# Patient Record
Sex: Male | Born: 1985 | Race: Asian | Hispanic: No | Marital: Married | Smoking: Never smoker
Health system: Southern US, Community
[De-identification: ages and names within clinical notes are randomized; demographics above are authoritative.]

## PROBLEM LIST (undated history)

## (undated) HISTORY — PX: EYE SURGERY: SHX253

---

## 2011-06-03 ENCOUNTER — Other Ambulatory Visit: Payer: Self-pay | Admitting: Infectious Diseases

## 2011-06-03 DIAGNOSIS — R9389 Abnormal findings on diagnostic imaging of other specified body structures: Secondary | ICD-10-CM

## 2011-06-05 ENCOUNTER — Ambulatory Visit
Admission: RE | Admit: 2011-06-05 | Discharge: 2011-06-05 | Disposition: A | Discharge status: Home / Self Care | Payer: No Typology Code available for payment source | Source: Ambulatory Visit | Attending: Infectious Diseases | Admitting: Infectious Diseases

## 2011-06-05 DIAGNOSIS — R9389 Abnormal findings on diagnostic imaging of other specified body structures: Secondary | ICD-10-CM

## 2011-06-21 ENCOUNTER — Inpatient Hospital Stay (INDEPENDENT_AMBULATORY_CARE_PROVIDER_SITE_OTHER)
Admission: RE | Admit: 2011-06-21 | Discharge: 2011-06-21 | Disposition: A | Discharge status: Home / Self Care | Payer: Self-pay | Source: Ambulatory Visit | Attending: Emergency Medicine | Admitting: Emergency Medicine

## 2011-06-21 DIAGNOSIS — J069 Acute upper respiratory infection, unspecified: Secondary | ICD-10-CM

## 2011-10-19 ENCOUNTER — Emergency Department (INDEPENDENT_AMBULATORY_CARE_PROVIDER_SITE_OTHER)
Admission: EM | Admit: 2011-10-19 | Discharge: 2011-10-19 | Disposition: A | Discharge status: Home / Self Care | Payer: Medicaid Other | Source: Home / Self Care | Attending: Emergency Medicine | Admitting: Emergency Medicine

## 2011-10-19 ENCOUNTER — Encounter: Payer: Self-pay | Admitting: *Deleted

## 2011-10-19 DIAGNOSIS — M546 Pain in thoracic spine: Secondary | ICD-10-CM

## 2011-10-19 MED ORDER — HYDROCODONE-ACETAMINOPHEN 5-325 MG PO TABS: 2.0000 | ORAL_TABLET | ORAL | Status: AC | PRN

## 2011-10-19 NOTE — ED Provider Notes (Addendum)
History     CSN: [id number]  Arrival date & time 10/19/11  1530   First MD Initiated Contact with Patient 10/19/11 1533      Chief Complaint  Patient presents with  . Chest Pain    (Consider location/radiation/quality/duration/timing/severity/associated sxs/prior treatment) HPI Comments: Was lifting something heavy yesterday, started hurting on my back " lie i pulled something"  NO SOB NO URINARY SYMPTOMS  Patient is a 26 y.o. male presenting with chest pain. The history is provided by the patient. The history is limited by a language barrier. No language interpreter was used.  Chest Pain The chest pain began yesterday. Chest pain occurs constantly. The chest pain is unchanged. The pain is associated with coughing and lifting. The quality of the pain is described as aching. The pain does not radiate. Chest pain is worsened by certain positions. Pertinent negatives for primary symptoms include no fever, no syncope, no shortness of breath, no cough, no wheezing, no abdominal pain, no nausea and no vomiting.  Pertinent negatives for associated symptoms include no lower extremity edema and no weakness.     History reviewed. No pertinent past medical history.  Past Surgical History  Procedure Date  . Eye surgery     History reviewed. No pertinent family history.  History  Substance Use Topics  . Smoking status: Never Smoker   . Smokeless tobacco: Not on file  . Alcohol Use: No      Review of Systems  Constitutional: Negative for fever.  Respiratory: Negative for cough, shortness of breath and wheezing.   Cardiovascular: Positive for chest pain. Negative for syncope.  Gastrointestinal: Negative for nausea, vomiting and abdominal pain.  Neurological: Negative for weakness.    Allergies  Review of patient's allergies indicates no known allergies.  Home Medications   Current Outpatient Rx  Name Route Sig Dispense Refill  . HYDROCODONE-ACETAMINOPHEN 5-325 MG PO TABS  Oral Take 2 tablets by mouth every 4 (four) hours as needed for pain. 10 tablet 0    BP 102/66  Pulse 67  Temp(Src) 98.3 F (36.8 C) (Oral)  Resp 20  SpO2 100%  Physical Exam  Nursing note and vitals reviewed. Constitutional: He appears well-developed and well-nourished.  Neck: Normal range of motion. Neck supple.  Cardiovascular: Normal rate.   Abdominal: Soft. There is no tenderness.  Musculoskeletal:       Lumbar back: He exhibits tenderness and pain. He exhibits normal range of motion, no swelling, no edema and no deformity.       Back:  Skin: No rash noted. No erythema. No pallor.    ED Course  Procedures (including critical care time)  Labs Reviewed - No data to display No results found.   1. Thoracic back pain       MDM  Lateral and posterior, thoracic pain post- lifting object. NO dyspnea, No localized swelling- or eccymosis        Jimmie Molly, MD 10/19/11 1949  Jimmie Molly, MD 10/19/11 1949

## 2011-10-19 NOTE — ED Notes (Signed)
Per pt carrying heavy object yesterday injured right rib area - sore to palpation - increased jpain with movement

## 2013-01-14 ENCOUNTER — Emergency Department (HOSPITAL_COMMUNITY)
Admission: EM | Admit: 2013-01-14 | Discharge: 2013-01-14 | Disposition: A | Discharge status: Home / Self Care | Payer: Self-pay | Attending: Emergency Medicine | Admitting: Emergency Medicine

## 2013-01-14 ENCOUNTER — Encounter (HOSPITAL_COMMUNITY): Payer: Self-pay | Admitting: Emergency Medicine

## 2013-01-14 DIAGNOSIS — IMO0001 Reserved for inherently not codable concepts without codable children: Secondary | ICD-10-CM | POA: Insufficient documentation

## 2013-01-14 DIAGNOSIS — R509 Fever, unspecified: Secondary | ICD-10-CM | POA: Insufficient documentation

## 2013-01-14 MED ORDER — HYDROCODONE-ACETAMINOPHEN 5-325 MG PO TABS: 2.0000 | ORAL_TABLET | ORAL | Status: AC | PRN

## 2013-01-14 MED ORDER — ACETAMINOPHEN 325 MG PO TABS
650.0000 mg | ORAL_TABLET | Freq: Four times a day (QID) | ORAL | Status: DC | PRN
  Administered 2013-01-14: 650 mg via ORAL
  Filled 2013-01-14 (×2): qty 2

## 2013-01-14 MED ORDER — HYDROCODONE-ACETAMINOPHEN 5-325 MG PO TABS
1.0000 | ORAL_TABLET | Freq: Once | ORAL | Status: AC
  Administered 2013-01-14: 1 via ORAL
  Filled 2013-01-14: qty 1

## 2013-01-14 MED ORDER — SODIUM CHLORIDE 0.9 % IV BOLUS (SEPSIS)
1000.0000 mL | Freq: Once | INTRAVENOUS | Status: AC
  Administered 2013-01-14: 1000 mL via INTRAVENOUS

## 2013-01-14 NOTE — ED Notes (Signed)
C/o fever and generalized body aches since Tuesday.

## 2013-01-14 NOTE — ED Provider Notes (Signed)
History     CSN: [id number]  Arrival date & time 01/14/13  0144   First MD Initiated Contact with Patient 01/14/13 [phone]      Chief Complaint  Patient presents with  . Fever  . Generalized Body Aches    (Consider location/radiation/quality/duration/timing/severity/associated sxs/prior treatment) Patient is a 27 y.o. male presenting with fever. The history is provided by the patient.  Fever Temp source:  Subjective Severity:  Severe Onset quality:  Gradual Duration:  2 days Timing:  Constant Progression:  Waxing and waning Chronicity:  New Relieved by:  Ibuprofen Worsened by:  Nothing tried Ineffective treatments:  Ibuprofen Associated symptoms: chills and myalgias     History reviewed. No pertinent past medical history.  Past Surgical History  Procedure Laterality Date  . Eye surgery      No family history on file.  History  Substance Use Topics  . Smoking status: Never Smoker   . Smokeless tobacco: Not on file  . Alcohol Use: No      Review of Systems  Constitutional: Positive for fever and chills.  Musculoskeletal: Positive for myalgias.  All other systems reviewed and are negative.    Allergies  Motrin  Home Medications   Current Outpatient Rx  Name  Route  Sig  Dispense  Refill  . ibuprofen (ADVIL,MOTRIN) 200 MG tablet   Oral   Take 400 mg by mouth every 6 (six) hours as needed for pain.           BP 98/61  Pulse 110  Temp(Src) 103 F (39.4 C) (Oral)  Resp 18  SpO2 98%  Physical Exam  Constitutional: He is oriented to person, place, and time. He appears well-developed and well-nourished.  HENT:  Head: Normocephalic and atraumatic.  Eyes: Conjunctivae are normal. Pupils are equal, round, and reactive to light.  Neck: Normal range of motion. Neck supple.  No meningeal sxs  Cardiovascular: Normal rate, regular rhythm, normal heart sounds and intact distal pulses.   Pulmonary/Chest: Effort normal and breath sounds normal.   Abdominal: Soft. Bowel sounds are normal.  Neurological: He is alert and oriented to person, place, and time.  Skin: Skin is warm and dry.  Psychiatric: He has a normal mood and affect. His behavior is normal. Judgment and thought content normal.    ED Course  Procedures (including critical care time)  Labs Reviewed - No data to display No results found.   No diagnosis found.    MDM  + fever, myalgias. No ha, no nausea. No meningismus        Hayat Warbington Lytle Michaels, MD 01/14/13 6578

## 2015-02-10 ENCOUNTER — Emergency Department (HOSPITAL_COMMUNITY)
Admission: EM | Admit: 2015-02-10 | Discharge: 2015-02-10 | Disposition: A | Discharge status: Home / Self Care | Payer: Medicaid Other | Attending: Emergency Medicine | Admitting: Emergency Medicine

## 2015-02-10 ENCOUNTER — Encounter (HOSPITAL_COMMUNITY): Payer: Self-pay

## 2015-02-10 ENCOUNTER — Emergency Department (HOSPITAL_COMMUNITY): Payer: Medicaid Other

## 2015-02-10 DIAGNOSIS — R630 Anorexia: Secondary | ICD-10-CM | POA: Insufficient documentation

## 2015-02-10 DIAGNOSIS — R1084 Generalized abdominal pain: Secondary | ICD-10-CM | POA: Diagnosis present

## 2015-02-10 DIAGNOSIS — R109 Unspecified abdominal pain: Secondary | ICD-10-CM

## 2015-02-10 LAB — I-STAT TROPONIN, ED: Troponin i, poc: 0 ng/mL (ref 0.00–0.08)

## 2015-02-10 LAB — CBC WITH DIFFERENTIAL/PLATELET
Basophils Absolute: 0 10*3/uL (ref 0.0–0.1)
Basophils Relative: 0 % (ref 0–1)
EOS ABS: 0.1 10*3/uL (ref 0.0–0.7)
Eosinophils Relative: 2 % (ref 0–5)
HEMATOCRIT: 42.4 % (ref 39.0–52.0)
HEMOGLOBIN: 13.8 g/dL (ref 13.0–17.0)
LYMPHS ABS: 1.7 10*3/uL (ref 0.7–4.0)
LYMPHS PCT: 28 % (ref 12–46)
MCH: 27.3 pg (ref 26.0–34.0)
MCHC: 32.5 g/dL (ref 30.0–36.0)
MCV: 84 fL (ref 78.0–100.0)
Monocytes Absolute: 0.6 10*3/uL (ref 0.1–1.0)
Monocytes Relative: 10 % (ref 3–12)
Neutro Abs: 3.5 10*3/uL (ref 1.7–7.7)
Neutrophils Relative %: 60 % (ref 43–77)
Platelets: 205 10*3/uL (ref 150–400)
RBC: 5.05 MIL/uL (ref 4.22–5.81)
RDW: 12 % (ref 11.5–15.5)
WBC: 5.8 10*3/uL (ref 4.0–10.5)

## 2015-02-10 LAB — COMPREHENSIVE METABOLIC PANEL
ALT: 14 U/L (ref 0–53)
ANION GAP: 11 (ref 5–15)
AST: 19 U/L (ref 0–37)
Albumin: 4 g/dL (ref 3.5–5.2)
Alkaline Phosphatase: 53 U/L (ref 39–117)
BILIRUBIN TOTAL: 1.1 mg/dL (ref 0.3–1.2)
BUN: 8 mg/dL (ref 6–23)
CO2: 26 mmol/L (ref 19–32)
CREATININE: 0.79 mg/dL (ref 0.50–1.35)
Calcium: 9.3 mg/dL (ref 8.4–10.5)
Chloride: 102 mmol/L (ref 96–112)
Glucose, Bld: 107 mg/dL — ABNORMAL HIGH (ref 70–99)
Potassium: 3.7 mmol/L (ref 3.5–5.1)
Sodium: 139 mmol/L (ref 135–145)
Total Protein: 7.3 g/dL (ref 6.0–8.3)

## 2015-02-10 LAB — LACTIC ACID, PLASMA: Lactic Acid, Venous: 1 mmol/L (ref 0.5–2.0)

## 2015-02-10 LAB — LIPASE, BLOOD: LIPASE: 26 U/L (ref 11–59)

## 2015-02-10 MED ORDER — IOHEXOL 300 MG/ML  SOLN
25.0000 mL | Freq: Once | INTRAMUSCULAR | Status: AC | PRN
  Administered 2015-02-10: 25 mL via ORAL

## 2015-02-10 MED ORDER — SODIUM CHLORIDE 0.9 % IV BOLUS (SEPSIS)
1000.0000 mL | Freq: Once | INTRAVENOUS | Status: AC
  Administered 2015-02-10: 1000 mL via INTRAVENOUS

## 2015-02-10 MED ORDER — IOHEXOL 300 MG/ML  SOLN
80.0000 mL | Freq: Once | INTRAMUSCULAR | Status: AC | PRN
  Administered 2015-02-10: 100 mL via INTRAVENOUS

## 2015-02-10 MED ORDER — GI COCKTAIL ~~LOC~~
30.0000 mL | Freq: Once | ORAL | Status: AC
  Administered 2015-02-10: 30 mL via ORAL
  Filled 2015-02-10: qty 30

## 2015-02-10 NOTE — ED Notes (Signed)
Pt will not answer questions about what the pain feels like, he just keeps saying "it's just pain"

## 2015-02-10 NOTE — ED Notes (Signed)
Pt transported to CT ?

## 2015-02-10 NOTE — Discharge Instructions (Signed)

## 2015-02-10 NOTE — ED Notes (Signed)
interpreter called.

## 2015-02-10 NOTE — ED Provider Notes (Signed)
CSN: [id number]     Arrival date & time 02/10/15  4098 History   First MD Initiated Contact with Patient 02/10/15 0536     Chief Complaint  Patient presents with  . Abdominal Pain     (Consider location/radiation/quality/duration/timing/severity/associated sxs/prior Treatment) Patient is a 29 y.o. male presenting with abdominal pain.  Abdominal Pain Pain location:  Generalized Pain quality: sharp   Pain radiates to:  Does not radiate Pain severity:  Moderate Onset quality:  Gradual Duration:  2 days Timing:  Constant Progression:  Unchanged Chronicity:  New Context comment:  After lifting heavy plywood Relieved by:  Nothing Worsened by:  Movement, palpation and eating Ineffective treatments:  None tried Associated symptoms: anorexia   Associated symptoms: no cough, no diarrhea, no fever, no nausea and no vomiting     History reviewed. No pertinent past medical history. Past Surgical History  Procedure Laterality Date  . Eye surgery     No family history on file. History  Substance Use Topics  . Smoking status: Never Smoker   . Smokeless tobacco: Not on file  . Alcohol Use: No    Review of Systems  Constitutional: Negative for fever.  Respiratory: Negative for cough.   Gastrointestinal: Positive for abdominal pain and anorexia. Negative for nausea, vomiting and diarrhea.  All other systems reviewed and are negative.     Allergies  Motrin  Home Medications   Prior to Admission medications   Medication Sig Start Date End Date Taking? Authorizing Provider  ibuprofen (ADVIL,MOTRIN) 200 MG tablet Take 400 mg by mouth every 6 (six) hours as needed for pain.   Yes Historical Provider, MD  HYDROcodone-acetaminophen (NORCO/VICODIN) 5-325 MG per tablet Take 2 tablets by mouth every 4 (four) hours as needed for pain. Patient not taking: Reported on 02/10/2015 01/14/13   Chionesu Sonyika, MD   BP 101/69 mmHg  Pulse 59  Temp(Src) 97.8 F (36.6 C) (Oral)  Resp 19   SpO2 100% Physical Exam  Constitutional: He is oriented to person, place, and time. He appears well-developed and well-nourished.  HENT:  Head: Normocephalic and atraumatic.  Eyes: Conjunctivae and EOM are normal.  Neck: Normal range of motion. Neck supple.  Cardiovascular: Normal rate, regular rhythm and normal heart sounds.   Pulmonary/Chest: Effort normal and breath sounds normal. No respiratory distress.  Abdominal: He exhibits no distension. There is generalized tenderness. There is no rebound and no guarding.  Genitourinary: Testes normal and penis normal. Cremasteric reflex is present.  Musculoskeletal: Normal range of motion.  Neurological: He is alert and oriented to person, place, and time.  Skin: Skin is warm and dry.  Vitals reviewed.   ED Course  Procedures (including critical care time) Labs Review Labs Reviewed  COMPREHENSIVE METABOLIC PANEL - Abnormal; Notable for the following:    Glucose, Bld 107 (*)    All other components within normal limits  CBC WITH DIFFERENTIAL/PLATELET  LIPASE, BLOOD  LACTIC ACID, PLASMA  LACTIC ACID, PLASMA  URINALYSIS, ROUTINE W REFLEX MICROSCOPIC  I-STAT TROPOININ, ED    Imaging Review Ct Abdomen Pelvis W Contrast  02/10/2015   CLINICAL DATA:  Acute lower abdominal pain after carrying heavy wood 2 days ago.  EXAM: CT ABDOMEN AND PELVIS WITH CONTRAST  TECHNIQUE: Multidetector CT imaging of the abdomen and pelvis was performed using the standard protocol following bolus administration of intravenous contrast.  CONTRAST:  OMNIPAQUE IOHEXOL 300 MG/ML  SOLN  COMPARISON:  None.  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  Visualized lung bases appear normal.  No gallstones are noted. The liver, spleen and pancreas appear normal. Adrenal glands and kidneys appear normal. No hydronephrosis or renal obstruction is noted. No renal or ureteral calculi are noted. The appendix appears normal. There is no evidence of bowel obstruction. No  abnormal fluid collection is noted. Urinary bladder appears normal. No significant adenopathy is noted.  IMPRESSION: No significant abnormality seen in the abdomen or pelvis.   Electronically Signed   By: Lupita RaiderJames  Green Jr, M.D.   On: 02/10/2015 07:30     EKG Interpretation None      MDM   Final diagnoses:  Abdominal pain    29 y.o. male without pertinent PMH presents with abd pain as above.  Physical exam as above, generalized tenderness, no focal areas of pain.  History limited by language barrier, even with interpreter phone used.    [name] unremarkable, including negative CT scan. Lactate normal.  Likely abd wall contusion/strain after lifting.  DC home in stable condition.  I have reviewed all laboratory and imaging studies if ordered as above  1. Abdominal pain         Mirian MoMatthew Mashanda Ishibashi, MD 02/10/15 [phone]

## 2015-02-10 NOTE — ED Notes (Signed)
Dr. Gentry at bedside. 

## 2015-02-10 NOTE — ED Notes (Signed)
Pt states that on Wednesday he was carrying some heavy wood and started to feel abdominal pain while he was carrying it.

## 2016-08-29 IMAGING — CT CT ABD-PELV W/ CM
2 of 4 series · 11 of 46 positions shown, 12 images · IV contrast (omnipaque)
Comparison: None.

CLINICAL DATA: Acute lower abdominal pain after carrying heavy [place]
2 days ago.

EXAM:
CT ABDOMEN AND PELVIS WITH CONTRAST
TECHNIQUE: Multidetector CT imaging of the abdomen and pelvis was performed
using the standard protocol following bolus administration of
intravenous contrast.
CONTRAST:  100mL OMNIPAQUE IOHEXOL 300 MG/ML  SOLN

[Series 201: routine, idose (2) · axial · 0.78mm/px · z∈[-495,-95]mm · 8 of 98 slices shown, 9 images]
[im 9/98  soft-tissue]
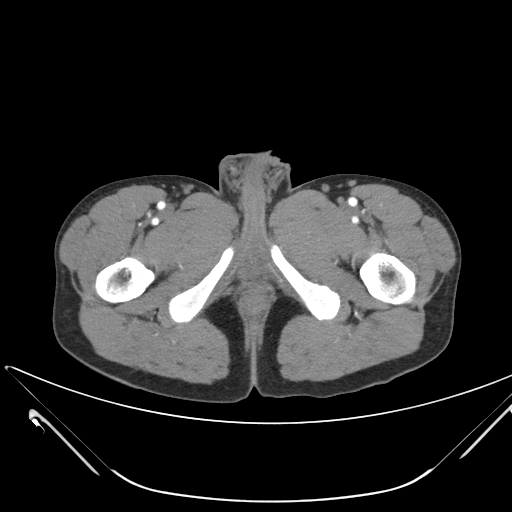
[im 9/98  bone]
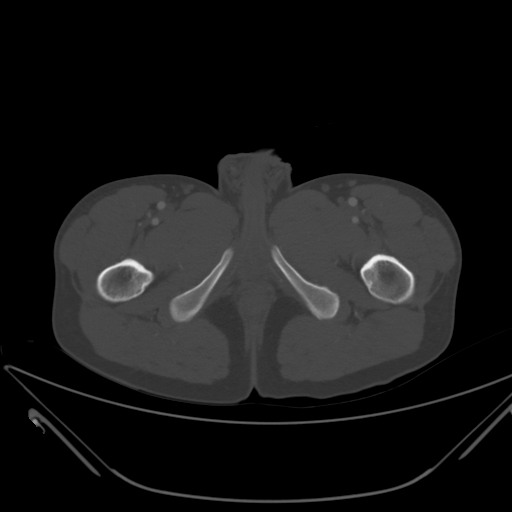
[im 22/98  soft-tissue]
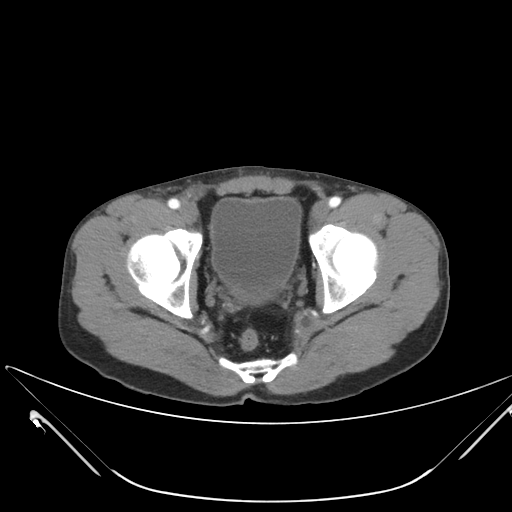
[im 30/98  soft-tissue]
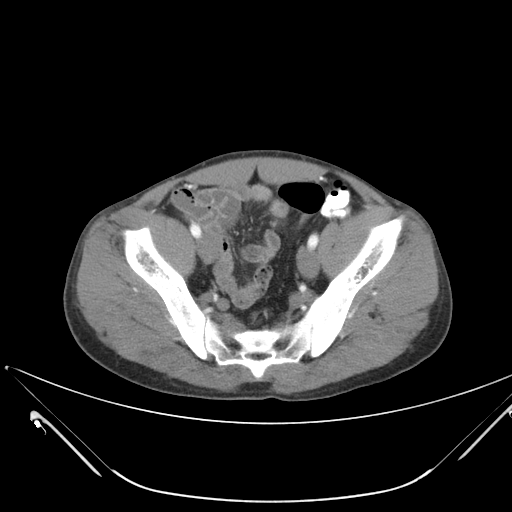
[im 43/98  soft-tissue]
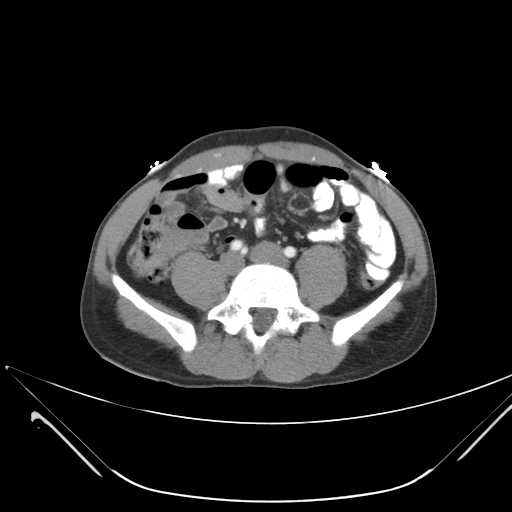
[im 55/98  soft-tissue]
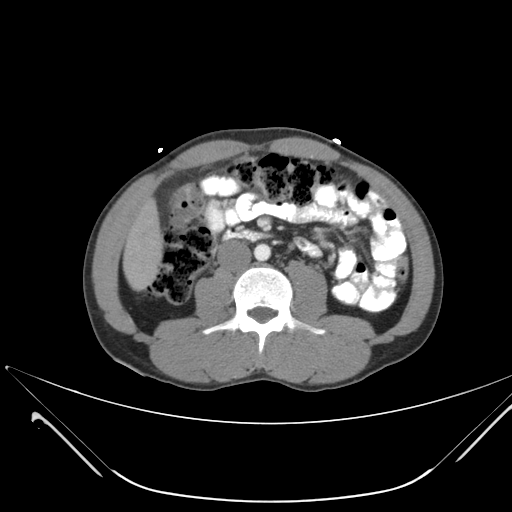
[im 68/98  soft-tissue]
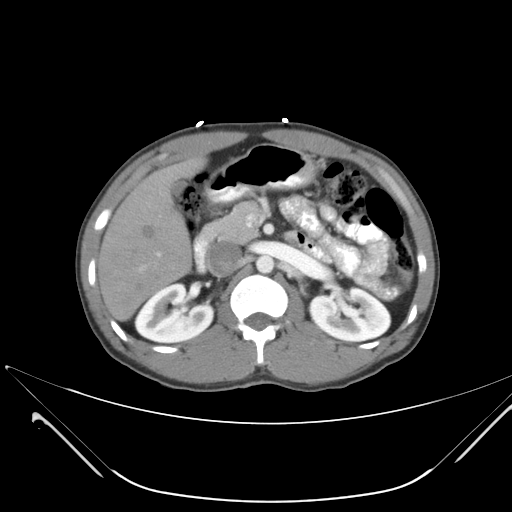
[im 76/98  soft-tissue]
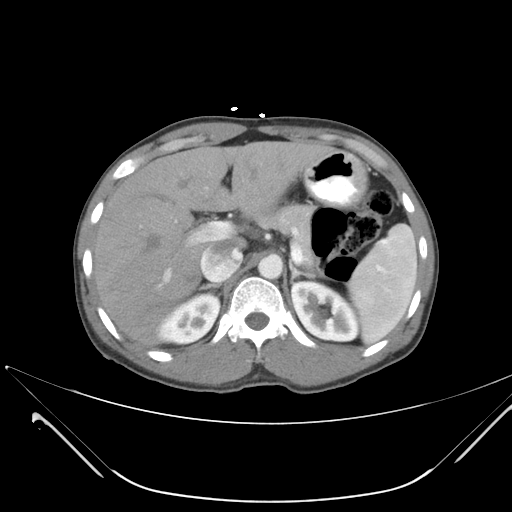
[im 89/98  soft-tissue]
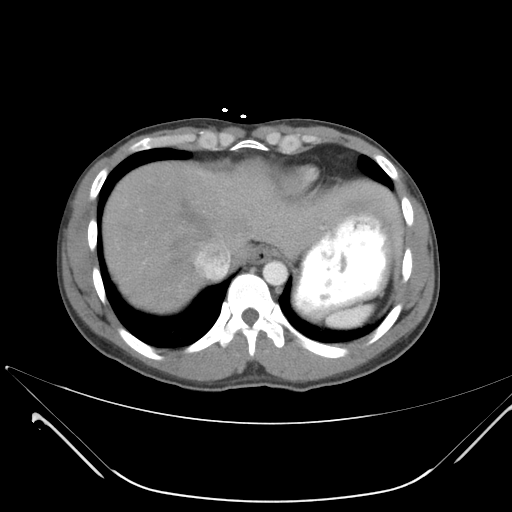

[Series 202: coronals, idose (2) · coronal · 0.45mm/px · 3 of 94 slices shown]
[im 32/94  soft-tissue]
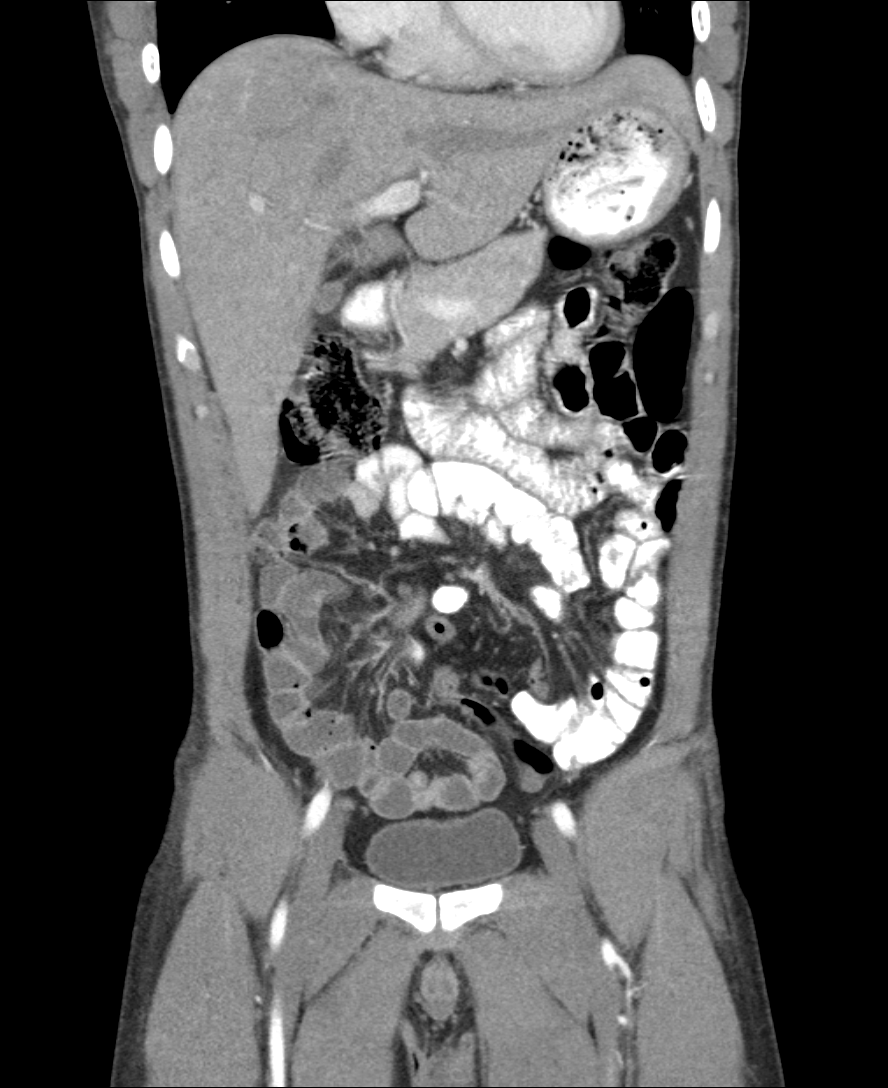
[im 42/94  soft-tissue]
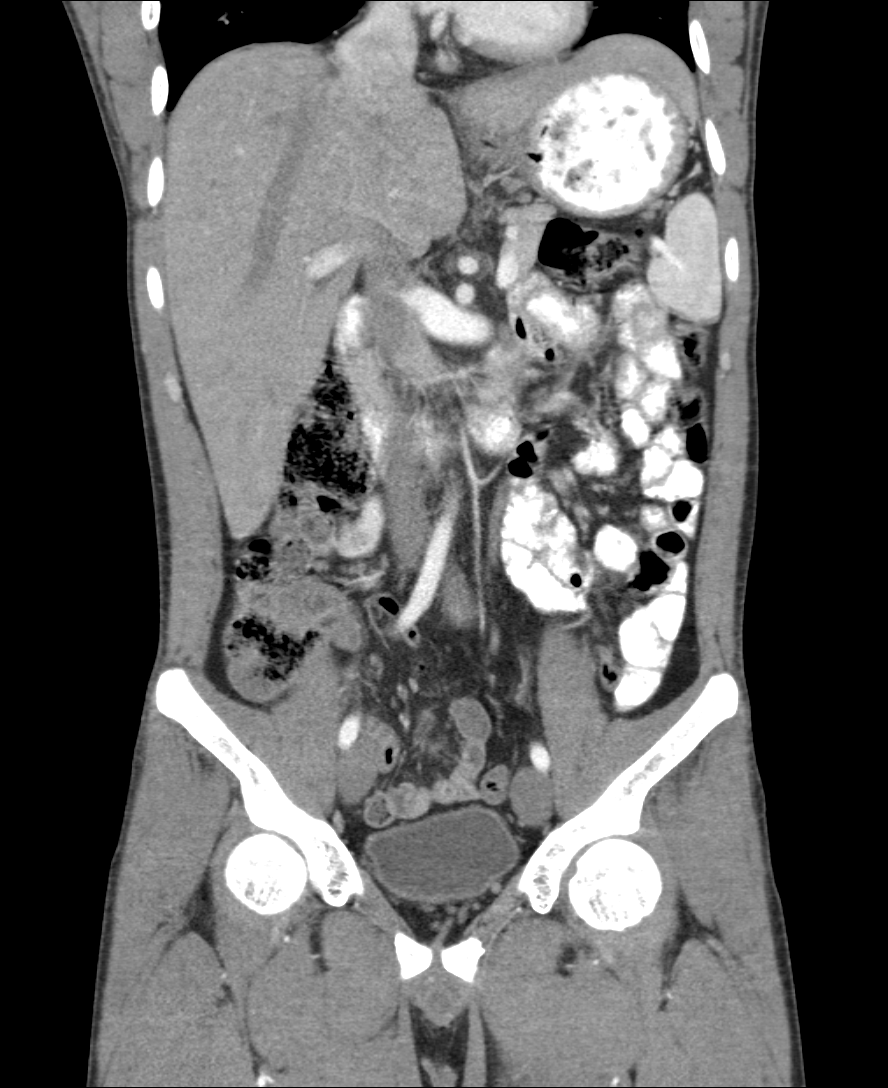
[im 52/94  soft-tissue]
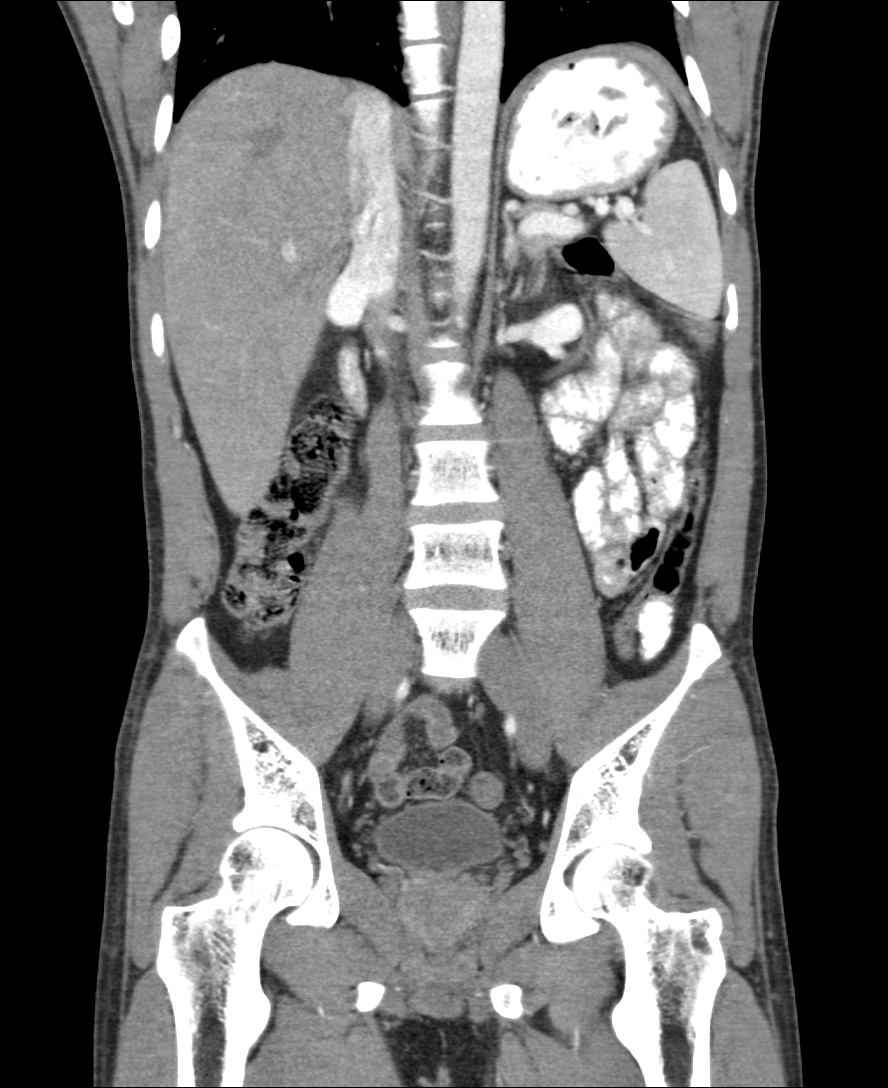

[11 of 46 positions shown; findings below may reference images not displayed]

FINDINGS: Bilateral pars defects are seen at L5. Visualized lung bases appear
normal.

No gallstones are noted. The liver, spleen and pancreas appear
normal. Adrenal glands and kidneys appear normal. No hydronephrosis
or renal obstruction is noted. No renal or ureteral calculi are
noted. The appendix appears normal. There is no evidence of bowel
obstruction. No abnormal fluid collection is noted. Urinary bladder
appears normal. No significant adenopathy is noted.
IMPRESSION: No significant abnormality seen in the abdomen or pelvis.
